# Patient Record
Sex: Male | Born: 1985
Health system: Southern US, Community
[De-identification: ages and names within clinical notes are randomized; demographics above are authoritative.]

---

## 2019-12-11 ENCOUNTER — Other Ambulatory Visit: Payer: Self-pay

## 2019-12-11 ENCOUNTER — Emergency Department (HOSPITAL_COMMUNITY): Payer: 59

## 2019-12-11 ENCOUNTER — Emergency Department (HOSPITAL_COMMUNITY)
Admission: EM | Admit: 2019-12-11 | Discharge: 2019-12-11 | Disposition: A | Discharge status: Home / Self Care | Payer: 59 | Attending: Emergency Medicine | Admitting: Emergency Medicine

## 2019-12-11 ENCOUNTER — Encounter (HOSPITAL_COMMUNITY): Payer: Self-pay

## 2019-12-11 DIAGNOSIS — S060X9A Concussion with loss of consciousness of unspecified duration, initial encounter: Secondary | ICD-10-CM | POA: Diagnosis not present

## 2019-12-11 DIAGNOSIS — S0083XA Contusion of other part of head, initial encounter: Secondary | ICD-10-CM | POA: Diagnosis not present

## 2019-12-11 DIAGNOSIS — R519 Headache, unspecified: Secondary | ICD-10-CM | POA: Diagnosis not present

## 2019-12-11 DIAGNOSIS — Y999 Unspecified external cause status: Secondary | ICD-10-CM | POA: Insufficient documentation

## 2019-12-11 DIAGNOSIS — M25562 Pain in left knee: Secondary | ICD-10-CM | POA: Diagnosis not present

## 2019-12-11 DIAGNOSIS — Y9389 Activity, other specified: Secondary | ICD-10-CM | POA: Diagnosis not present

## 2019-12-11 DIAGNOSIS — S0990XA Unspecified injury of head, initial encounter: Secondary | ICD-10-CM | POA: Diagnosis present

## 2019-12-11 DIAGNOSIS — Z79899 Other long term (current) drug therapy: Secondary | ICD-10-CM | POA: Diagnosis not present

## 2019-12-11 DIAGNOSIS — Y92410 Unspecified street and highway as the place of occurrence of the external cause: Secondary | ICD-10-CM | POA: Insufficient documentation

## 2019-12-11 LAB — COMPREHENSIVE METABOLIC PANEL
ALT: 19 U/L (ref 0–44)
AST: 22 U/L (ref 15–41)
Albumin: 4.3 g/dL (ref 3.5–5.0)
Alkaline Phosphatase: 76 U/L (ref 38–126)
Anion gap: 12 (ref 5–15)
BUN: 12 mg/dL (ref 6–20)
CO2: 21 mmol/L — ABNORMAL LOW (ref 22–32)
Calcium: 9.4 mg/dL (ref 8.9–10.3)
Chloride: 105 mmol/L (ref 98–111)
Creatinine, Ser: 1.05 mg/dL (ref 0.61–1.24)
GFR calc Af Amer: >60 mL/min (ref >60)
GFR calc non Af Amer: >60 mL/min (ref >60)
Glucose, Bld: 101 mg/dL — ABNORMAL HIGH (ref 70–99)
Potassium: 4 mmol/L (ref 3.5–5.1)
Sodium: 138 mmol/L (ref 135–145)
Total Bilirubin: 1.1 mg/dL (ref 0.3–1.2)
Total Protein: 6.9 g/dL (ref 6.5–8.1)

## 2019-12-11 LAB — PROTIME-INR
INR: 1 (ref 0.8–1.2)
Prothrombin Time: 13.5 seconds (ref 11.4–15.2)

## 2019-12-11 LAB — CBC WITH DIFFERENTIAL/PLATELET
Abs Immature Granulocytes: 0.03 10*3/uL (ref 0.00–0.07)
Basophils Absolute: 0 10*3/uL (ref 0.0–0.1)
Basophils Relative: 0 %
Eosinophils Absolute: 0 10*3/uL (ref 0.0–0.5)
Eosinophils Relative: 0 %
HCT: 45.4 % (ref 39.0–52.0)
Hemoglobin: 15.5 g/dL (ref 13.0–17.0)
Immature Granulocytes: 0 %
Lymphocytes Relative: 23 %
Lymphs Abs: 2 10*3/uL (ref 0.7–4.0)
MCH: 28.3 pg (ref 26.0–34.0)
MCHC: 34.1 g/dL (ref 30.0–36.0)
MCV: 83 fL (ref 80.0–100.0)
Monocytes Absolute: 0.8 10*3/uL (ref 0.1–1.0)
Monocytes Relative: 9 %
Neutro Abs: 6 10*3/uL (ref 1.7–7.7)
Neutrophils Relative %: 68 %
Platelets: 210 10*3/uL (ref 150–400)
RBC: 5.47 MIL/uL (ref 4.22–5.81)
RDW: 13.1 % (ref 11.5–15.5)
WBC: 8.9 10*3/uL (ref 4.0–10.5)
nRBC: 0 % (ref 0.0–0.2)

## 2019-12-11 LAB — ETHANOL: Alcohol, Ethyl (B): <10 mg/dL (ref <10)

## 2019-12-11 MED ORDER — ONDANSETRON HCL 4 MG/2ML IJ SOLN
4.0000 mg | Freq: Once | INTRAMUSCULAR | Status: AC
  Administered 2019-12-11: 4 mg via INTRAVENOUS
  Filled 2019-12-11: qty 2

## 2019-12-11 MED ORDER — HYDROMORPHONE HCL 1 MG/ML IJ SOLN
1.0000 mg | Freq: Once | INTRAMUSCULAR | Status: AC
  Administered 2019-12-11: 1 mg via INTRAVENOUS
  Filled 2019-12-11: qty 1

## 2019-12-11 MED ORDER — IOHEXOL 300 MG/ML  SOLN
100.0000 mL | Freq: Once | INTRAMUSCULAR | Status: AC | PRN
  Administered 2019-12-11: 100 mL via INTRAVENOUS

## 2019-12-11 MED ORDER — HYDROCODONE-ACETAMINOPHEN 5-325 MG PO TABS: 1.0000 | ORAL_TABLET | ORAL | 0 refills | Status: DC | PRN

## 2019-12-11 NOTE — ED Notes (Signed)
CT notified that pt is ready for transport 

## 2019-12-11 NOTE — ED Notes (Signed)
Pt wife would like an update

## 2019-12-11 NOTE — ED Notes (Signed)
Discharge instructions reviewed with pt. Pt verbalized understanding.   

## 2019-12-11 NOTE — Discharge Instructions (Signed)
Return if any problems.
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
## 2019-12-16 ENCOUNTER — Other Ambulatory Visit: Payer: Self-pay

## 2019-12-16 ENCOUNTER — Encounter: Payer: Self-pay | Admitting: Orthopaedic Surgery

## 2019-12-16 ENCOUNTER — Ambulatory Visit: Payer: 59 | Admitting: Orthopaedic Surgery

## 2019-12-16 VITALS — Ht 68.5 in | Wt 204.0 lb

## 2019-12-16 DIAGNOSIS — M25562 Pain in left knee: Secondary | ICD-10-CM | POA: Diagnosis not present
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
## 2019-12-20 ENCOUNTER — Other Ambulatory Visit: Payer: Self-pay | Admitting: Orthopaedic Surgery

## 2019-12-20 DIAGNOSIS — Z77018 Contact with and (suspected) exposure to other hazardous metals: Secondary | ICD-10-CM

## 2019-12-21 ENCOUNTER — Telehealth: Payer: Self-pay | Admitting: Orthopaedic Surgery

## 2019-12-21 ENCOUNTER — Other Ambulatory Visit: Payer: Self-pay | Admitting: Physician Assistant

## 2019-12-21 MED ORDER — LIDOCAINE 5 % EX PTCH: 1.0000 | MEDICATED_PATCH | CUTANEOUS | 0 refills | Status: AC

## 2019-12-21 NOTE — Telephone Encounter (Signed)
Just sent in

## 2019-12-21 NOTE — Telephone Encounter (Signed)
Patient called.   A friend of his is using a pain relieving patch called Lidocaine and he wants to discuss being prescribed the same medication.   Call back: [phone]

## 2019-12-22 NOTE — Telephone Encounter (Signed)
Patient aware.

## 2019-12-28 ENCOUNTER — Ambulatory Visit
Admission: RE | Admit: 2019-12-28 | Discharge: 2019-12-28 | Disposition: A | Discharge status: Home / Self Care | Payer: 59 | Source: Ambulatory Visit | Attending: Orthopaedic Surgery | Admitting: Orthopaedic Surgery

## 2019-12-28 ENCOUNTER — Other Ambulatory Visit: Payer: Self-pay

## 2019-12-28 DIAGNOSIS — Z77018 Contact with and (suspected) exposure to other hazardous metals: Secondary | ICD-10-CM

## 2019-12-29 ENCOUNTER — Ambulatory Visit
Admission: RE | Admit: 2019-12-29 | Discharge: 2019-12-29 | Disposition: A | Discharge status: Home / Self Care | Payer: 59 | Source: Ambulatory Visit | Attending: Orthopaedic Surgery | Admitting: Orthopaedic Surgery

## 2019-12-29 ENCOUNTER — Other Ambulatory Visit: Payer: Self-pay

## 2019-12-29 DIAGNOSIS — M25562 Pain in left knee: Secondary | ICD-10-CM

## 2020-01-04 ENCOUNTER — Ambulatory Visit: Payer: 59 | Admitting: Orthopaedic Surgery

## 2020-01-07 ENCOUNTER — Ambulatory Visit: Payer: Self-pay

## 2020-01-07 ENCOUNTER — Encounter: Payer: Self-pay | Admitting: Orthopaedic Surgery

## 2020-01-07 ENCOUNTER — Ambulatory Visit: Payer: 59 | Admitting: Orthopaedic Surgery

## 2020-01-07 ENCOUNTER — Other Ambulatory Visit: Payer: Self-pay

## 2020-01-07 VITALS — Ht 68.5 in | Wt 204.0 lb

## 2020-01-07 DIAGNOSIS — S83412A Sprain of medial collateral ligament of left knee, initial encounter: Secondary | ICD-10-CM

## 2020-01-07 DIAGNOSIS — M25562 Pain in left knee: Secondary | ICD-10-CM | POA: Diagnosis not present
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
## 2020-07-02 IMAGING — CR DG ORBITS FOR FOREIGN BODY
2 series · 2 of 2 positions shown · non-contrast
Comparison: None.

CLINICAL DATA: Metal working/exposure; clearance prior to MRI

EXAM:
ORBITS FOR FOREIGN BODY - 2 VIEW

[w orbit pa (1 of 2)]
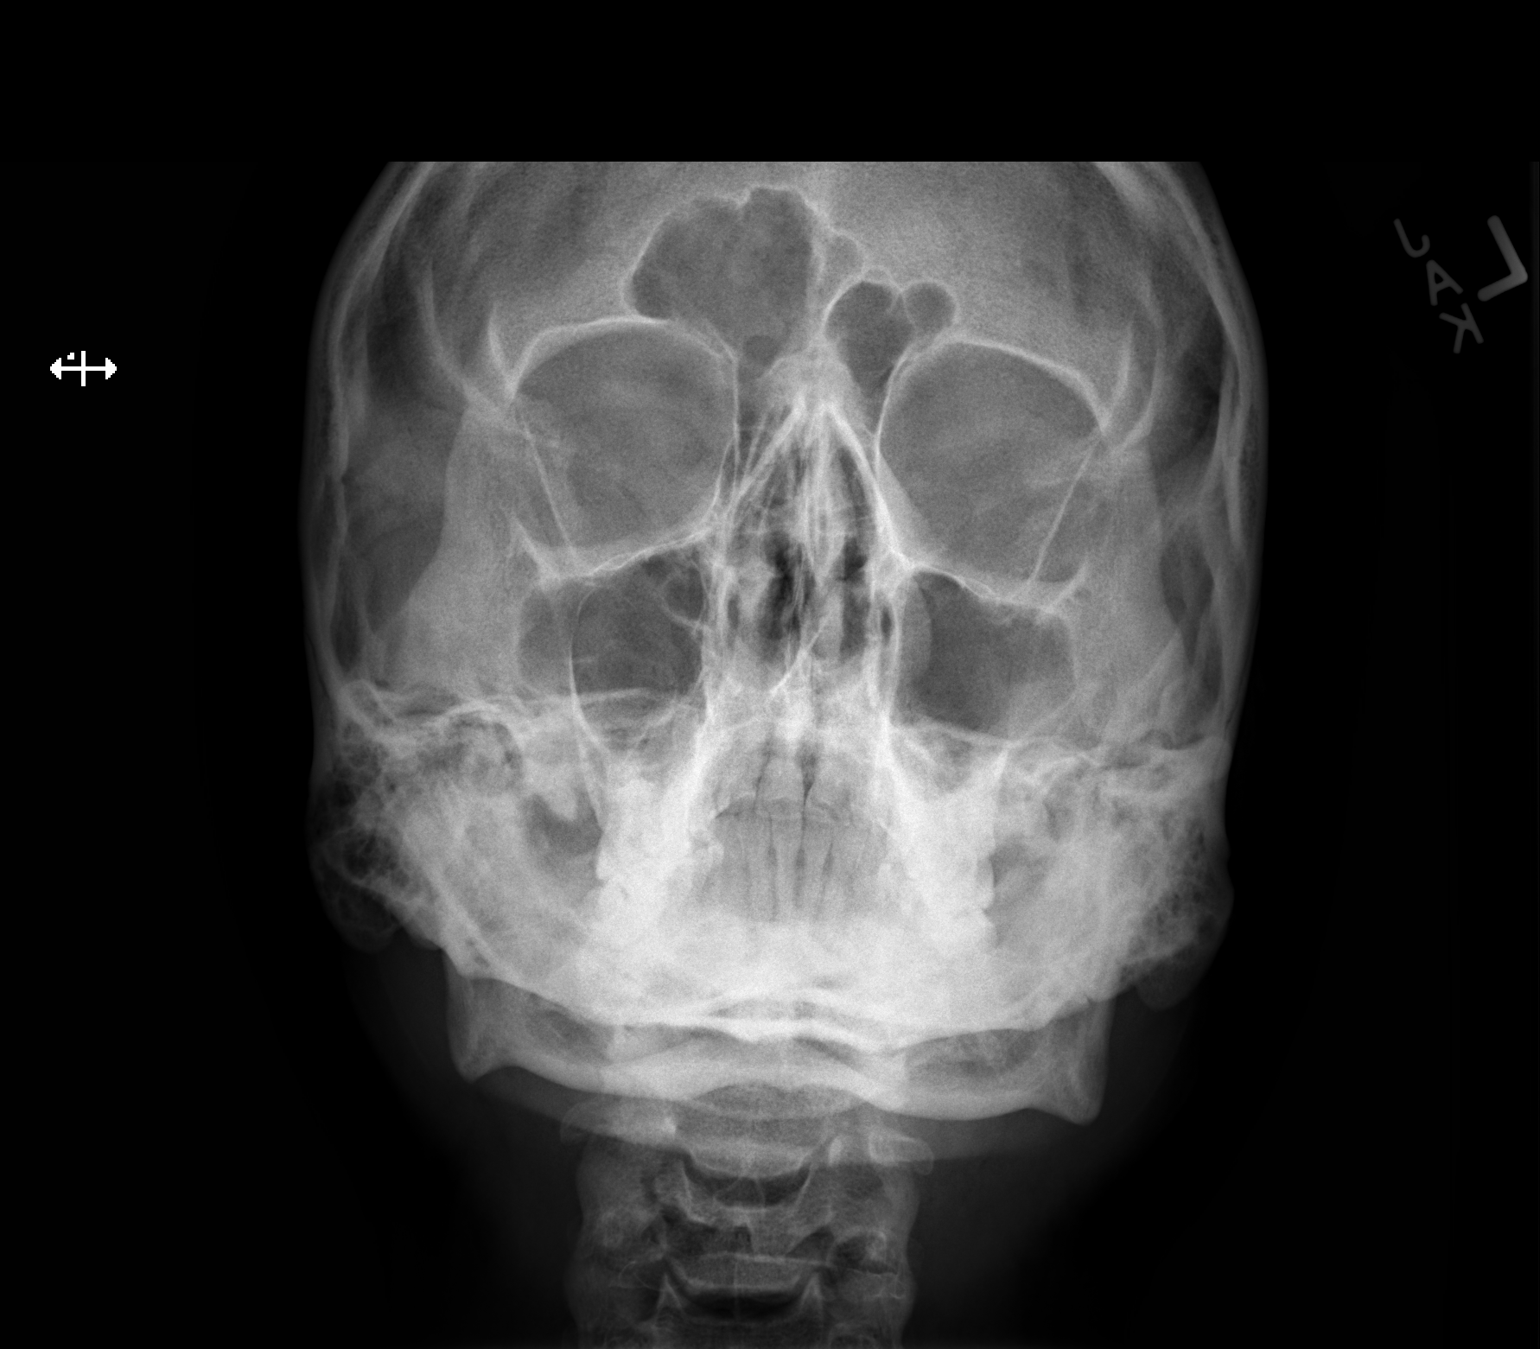

[w orbit pa (2 of 2)]
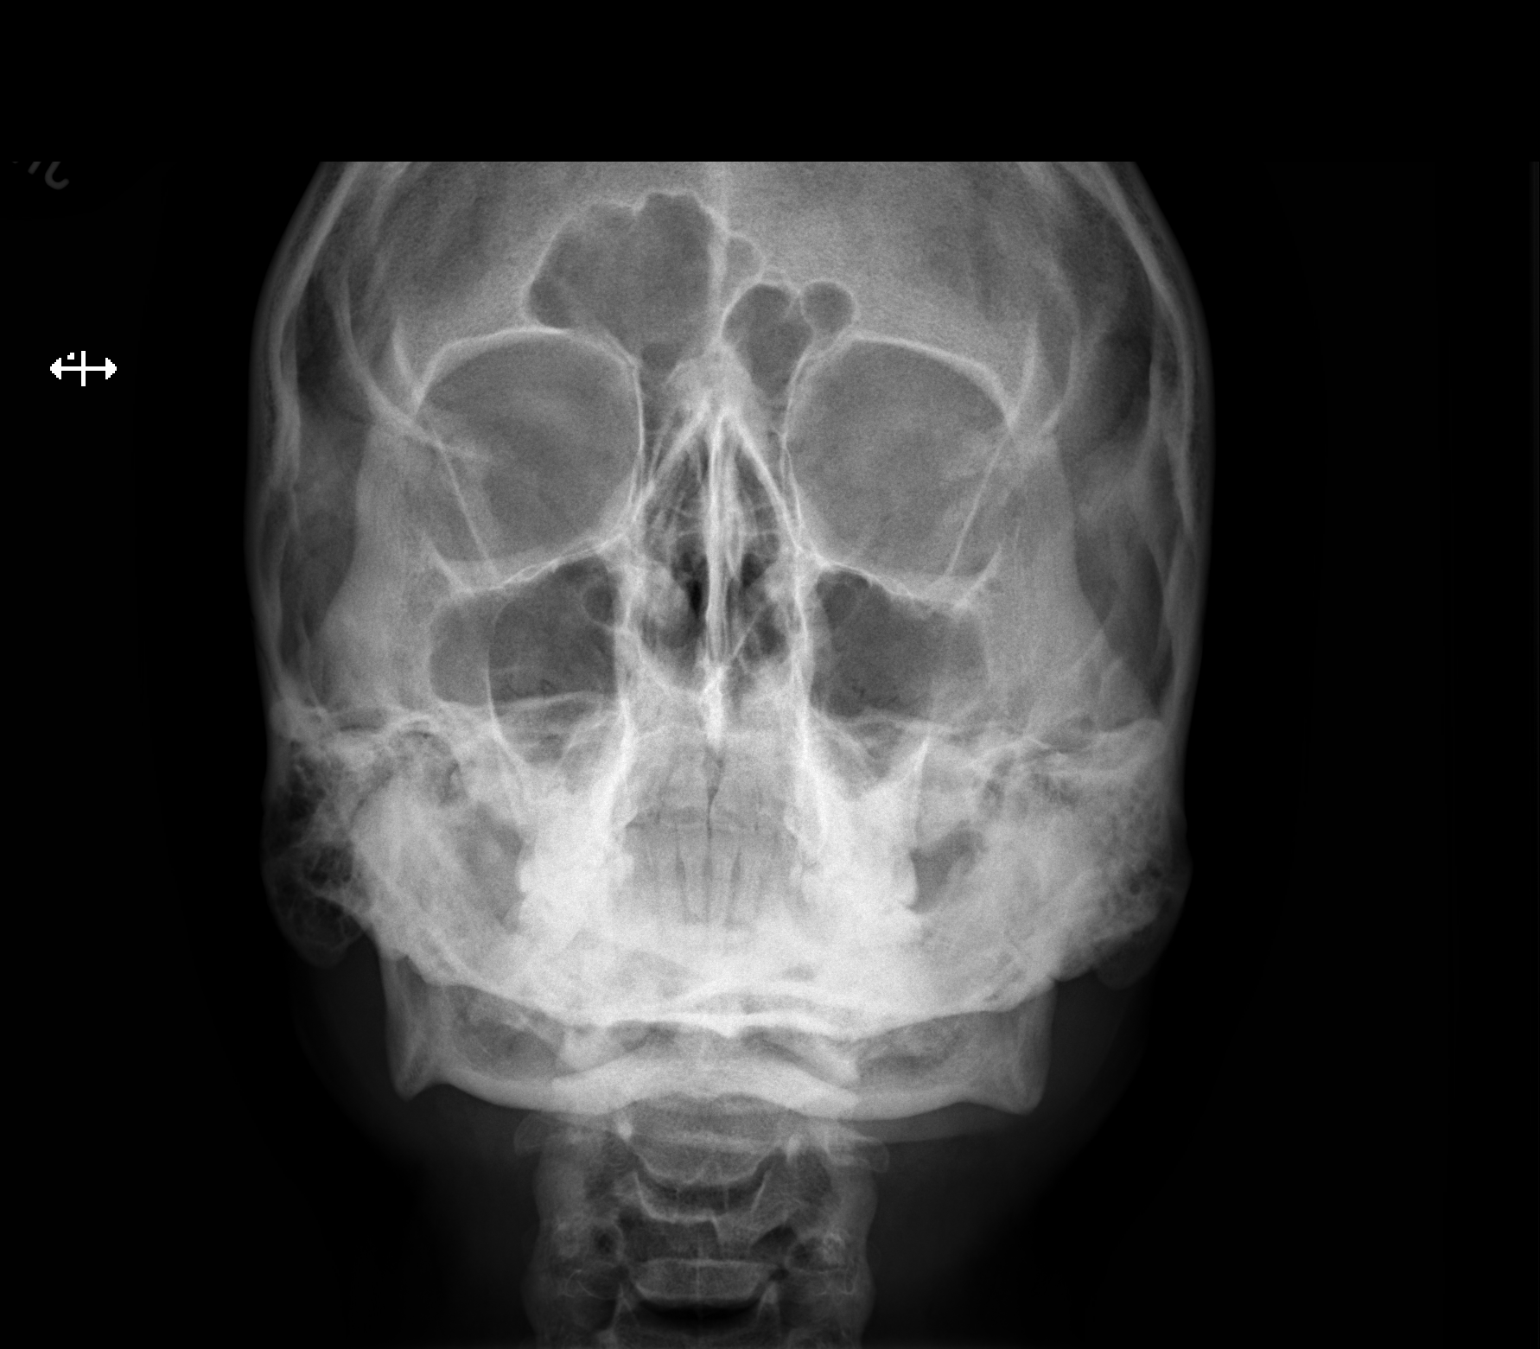

[2 of 2 positions shown; findings below may reference images not displayed]

FINDINGS: There is no evidence of metallic foreign body within the orbits. No
significant bone abnormality identified.
IMPRESSION: No evidence of metallic foreign body within the orbits.

## 2020-11-21 NOTE — Patient Instructions (Signed)
DUE TO COVID-19 ONLY ONE VISITOR IS ALLOWED TO COME WITH YOU AND STAY IN THE WAITING ROOM ONLY DURING PRE OP AND PROCEDURE DAY OF SURGERY. THE 1 VISITOR  MAY VISIT WITH YOU AFTER SURGERY IN YOUR PRIVATE ROOM DURING VISITING HOURS ONLY!  YOU NEED TO HAVE A COVID 19 TEST ON: 11/22/20 @ 10:00 AM, THIS TEST MUST BE DONE BEFORE SURGERY,  COVID TESTING SITE [address] [place] [address], IT IS ON THE RIGHT GOING OUT [address] APPROXIMATELY  2 MINUTES PAST [employer] ON THE RIGHT. ONCE YOUR COVID TEST IS COMPLETED,  PLEASE BEGIN THE QUARANTINE INSTRUCTIONS AS OUTLINED IN YOUR HANDOUT.                Steven Vaughn    Your procedure is scheduled on: 11/24/20   Report to [facility] Main  Entrance   Report to admitting at: 12:00 PM     Call this number if you have problems the morning of surgery [phone]    Remember: Do not eat solid food :After Midnight. Clear liquids until: 11:00 am.  CLEAR LIQUID DIET  Foods Allowed                                                                     Foods Excluded  Coffee and tea, regular and decaf                             liquids that you cannot  Plain Jell-O any favor except red or purple                                           see through such as: Fruit ices (not with fruit pulp)                                     milk, soups, orange juice  Iced Popsicles                                    All solid food Carbonated beverages, regular and diet                                    Cranberry, grape and apple juices Sports drinks like Gatorade Lightly seasoned clear broth or consume(fat free) Sugar, honey syrup  Sample Menu Breakfast                                Lunch                                     Supper Cranberry juice  Beef broth                            Chicken broth Jell-O                                     Grape juice                           Apple juice Coffee or tea                         Jell-O                                      Popsicle                                                Coffee or tea                        Coffee or tea  _____________________________________________________________________  BRUSH YOUR TEETH MORNING OF SURGERY AND RINSE YOUR MOUTH OUT, NO CHEWING GUM CANDY OR MINTS.                              You may not have any metal on your body including hair pins and              piercings  Do not wear jewelry, lotions, powders or perfumes, deodorant             Men may shave face and neck.   Do not bring valuables to the hospital. [facility] IS NOT             RESPONSIBLE   FOR VALUABLES.  Contacts, dentures or bridgework may not be worn into surgery.  Leave suitcase in the car. After surgery it may be brought to your room.     Patients discharged the day of surgery will not be allowed to drive home. IF YOU ARE HAVING SURGERY AND GOING HOME THE SAME DAY, YOU MUST HAVE AN ADULT TO DRIVE YOU HOME AND BE WITH YOU FOR 24 HOURS. YOU MAY GO HOME BY TAXI OR UBER OR ORTHERWISE, BUT AN ADULT MUST ACCOMPANY YOU HOME AND STAY WITH YOU FOR 24 HOURS.  Name and phone number of your driver:  Special Instructions: N/A              Please read over the following fact sheets you were given: _____________________________________________________________________          [facility] - Preparing for Surgery Before surgery, you can play an important role.  Because skin is not sterile, your skin needs to be as free of germs as possible.  You can reduce the number of germs on your skin by washing with CHG (chlorahexidine gluconate) soap before surgery.  CHG is an antiseptic cleaner which kills germs and bonds with the skin to continue killing germs even after washing. Please DO NOT use if you have an allergy to CHG or antibacterial soaps.  If your skin becomes reddened/irritated stop using the CHG and inform your nurse when you arrive at Short Stay. Do not  shave (including legs and underarms) for at least 48 hours prior to the first CHG shower.  You may shave your face/neck. Please follow these instructions carefully:  1.  Shower with CHG Soap the night before surgery and the  morning of Surgery.  2.  If you choose to wash your hair, wash your hair first as usual with your  normal  shampoo.  3.  After you shampoo, rinse your hair and body thoroughly to remove the  shampoo.                           4.  Use CHG as you would any other liquid soap.  You can apply chg directly  to the skin and wash                       Gently with a scrungie or clean washcloth.  5.  Apply the CHG Soap to your body ONLY FROM THE NECK DOWN.   Do not use on face/ open                           Wound or open sores. Avoid contact with eyes, ears mouth and genitals (private parts).                       Wash face,  Genitals (private parts) with your normal soap.             6.  Wash thoroughly, paying special attention to the area where your surgery  will be performed.  7.  Thoroughly rinse your body with warm water from the neck down.  8.  DO NOT shower/wash with your normal soap after using and rinsing off  the CHG Soap.                9.  Pat yourself dry with a clean towel.            10.  Wear clean pajamas.            11.  Place clean sheets on your bed the night of your first shower and do not  sleep with pets. Day of Surgery : Do not apply any lotions/deodorants the morning of surgery.  Please wear clean clothes to the hospital/surgery center.  FAILURE TO FOLLOW THESE INSTRUCTIONS MAY RESULT IN THE CANCELLATION OF YOUR SURGERY PATIENT SIGNATURE_________________________________  NURSE SIGNATURE__________________________________  ________________________________________________________________________

## 2020-11-21 NOTE — Progress Notes (Signed)
Pt. Needs orders for upcoming surgery.PAT and labs on 11/22/20.Thanks

## 2020-11-22 ENCOUNTER — Encounter (HOSPITAL_COMMUNITY): Payer: Self-pay

## 2020-11-22 ENCOUNTER — Inpatient Hospital Stay (HOSPITAL_COMMUNITY)
Admission: RE | Admit: 2020-11-22 | Discharge: 2020-11-22 | Disposition: A | Discharge status: Home / Self Care | Payer: 59 | Source: Ambulatory Visit

## 2020-11-22 ENCOUNTER — Encounter (HOSPITAL_COMMUNITY)
Admission: RE | Admit: 2020-11-22 | Discharge: 2020-11-22 | Disposition: A | Discharge status: Home / Self Care | Payer: BC Managed Care – PPO | Source: Ambulatory Visit | Attending: Orthopedic Surgery | Admitting: Orthopedic Surgery

## 2020-11-22 ENCOUNTER — Other Ambulatory Visit: Payer: Self-pay

## 2020-11-22 DIAGNOSIS — Z880 Allergy status to penicillin: Secondary | ICD-10-CM | POA: Diagnosis not present

## 2020-11-22 DIAGNOSIS — Z01812 Encounter for preprocedural laboratory examination: Secondary | ICD-10-CM | POA: Insufficient documentation

## 2020-11-22 DIAGNOSIS — S83281A Other tear of lateral meniscus, current injury, right knee, initial encounter: Secondary | ICD-10-CM | POA: Diagnosis not present

## 2020-11-22 DIAGNOSIS — X58XXXA Exposure to other specified factors, initial encounter: Secondary | ICD-10-CM | POA: Diagnosis not present

## 2020-11-22 LAB — CBC
HCT: 43.7 % (ref 39.0–52.0)
Hemoglobin: 14.6 g/dL (ref 13.0–17.0)
MCH: 28.7 pg (ref 26.0–34.0)
MCHC: 33.4 g/dL (ref 30.0–36.0)
MCV: 86 fL (ref 80.0–100.0)
Platelets: 176 10*3/uL (ref 150–400)
RBC: 5.08 MIL/uL (ref 4.22–5.81)
RDW: 12.9 % (ref 11.5–15.5)
WBC: 4.8 10*3/uL (ref 4.0–10.5)
nRBC: 0 % (ref 0.0–0.2)

## 2020-11-22 NOTE — Progress Notes (Signed)
Pt tested positive 09/18/20 for covid. Pt has copy of his results and will bring with him on the DOS. Due to 90 day retest policy, pt does not need retest prior to surgery on 2/25.

## 2020-11-22 NOTE — Progress Notes (Signed)
COVID Vaccine Completed: NO Date COVID Vaccine completed: COVID vaccine manufacturer: Pfizer    Moderna   [employer]   PCP - Flora Lipps NP Cardiologist -   Chest x-ray -  EKG -  Stress Test -  ECHO -  Cardiac Cath -  Pacemaker/ICD device last checked:  Sleep Study -  CPAP -   Fasting Blood Sugar -  Checks Blood Sugar _____ times a day  Blood Thinner Instructions: Aspirin Instructions: Last Dose:  Anesthesia review:   Patient denies shortness of breath, fever, cough and chest pain at PAT appointment   Patient verbalized understanding of instructions that were given to them at the PAT appointment. Patient was also instructed that they will need to review over the PAT instructions again at home before surgery.

## 2020-11-24 ENCOUNTER — Ambulatory Visit (HOSPITAL_COMMUNITY): Payer: BC Managed Care – PPO | Admitting: Certified Registered"

## 2020-11-24 ENCOUNTER — Encounter (HOSPITAL_COMMUNITY)
Admission: RE | Disposition: A | Discharge status: Home / Self Care | Payer: Self-pay | Source: Home / Self Care | Attending: Orthopedic Surgery

## 2020-11-24 ENCOUNTER — Encounter (HOSPITAL_COMMUNITY): Payer: Self-pay | Admitting: Orthopedic Surgery

## 2020-11-24 ENCOUNTER — Other Ambulatory Visit: Payer: Self-pay

## 2020-11-24 ENCOUNTER — Ambulatory Visit (HOSPITAL_COMMUNITY)
Admission: RE | Admit: 2020-11-24 | Discharge: 2020-11-24 | Disposition: A | Discharge status: Home / Self Care | Payer: BC Managed Care – PPO | Attending: Orthopedic Surgery | Admitting: Orthopedic Surgery

## 2020-11-24 DIAGNOSIS — Z880 Allergy status to penicillin: Secondary | ICD-10-CM | POA: Insufficient documentation

## 2020-11-24 DIAGNOSIS — X58XXXA Exposure to other specified factors, initial encounter: Secondary | ICD-10-CM | POA: Insufficient documentation

## 2020-11-24 DIAGNOSIS — S83281A Other tear of lateral meniscus, current injury, right knee, initial encounter: Secondary | ICD-10-CM | POA: Insufficient documentation

## 2020-11-24 HISTORY — PX: KNEE ARTHROSCOPY WITH LATERAL MENISECTOMY: SHX6193

## 2020-11-24 SURGERY — ARTHROSCOPY, KNEE, WITH LATERAL MENISCECTOMY
Anesthesia: General | Site: Knee | Laterality: Right

## 2020-11-24 MED ORDER — CEFAZOLIN SODIUM-DEXTROSE 2-4 GM/100ML-% IV SOLN
2.0000 g | INTRAVENOUS | Status: AC
  Administered 2020-11-24: 2 g via INTRAVENOUS
  Filled 2020-11-24: qty 100

## 2020-11-24 MED ORDER — FENTANYL CITRATE (PF) 100 MCG/2ML IJ SOLN
25.0000 ug | INTRAMUSCULAR | Status: DC | PRN
  Administered 2020-11-24 (×3): 50 ug via INTRAVENOUS

## 2020-11-24 MED ORDER — ONDANSETRON HCL 4 MG/2ML IJ SOLN
INTRAMUSCULAR | Status: DC | PRN
  Administered 2020-11-24: 4 mg via INTRAVENOUS

## 2020-11-24 MED ORDER — PROMETHAZINE HCL 25 MG/ML IJ SOLN: 6.2500 mg | INTRAMUSCULAR | Status: DC | PRN

## 2020-11-24 MED ORDER — PROPOFOL 10 MG/ML IV BOLUS
INTRAVENOUS | Status: AC
  Filled 2020-11-24: qty 20

## 2020-11-24 MED ORDER — ROPIVACAINE HCL 5 MG/ML IJ SOLN
INTRAMUSCULAR | Status: DC | PRN
  Administered 2020-11-24: 30 mL via PERINEURAL

## 2020-11-24 MED ORDER — EPHEDRINE 5 MG/ML INJ
INTRAVENOUS | Status: AC
  Filled 2020-11-24: qty 10

## 2020-11-24 MED ORDER — KETOROLAC TROMETHAMINE 30 MG/ML IJ SOLN
30.0000 mg | Freq: Once | INTRAMUSCULAR | Status: AC | PRN
  Administered 2020-11-24: 30 mg via INTRAVENOUS

## 2020-11-24 MED ORDER — KETOROLAC TROMETHAMINE 30 MG/ML IJ SOLN
INTRAMUSCULAR | Status: AC
  Filled 2020-11-24: qty 1

## 2020-11-24 MED ORDER — HYDROCODONE-ACETAMINOPHEN 5-325 MG PO TABS: 1.0000 | ORAL_TABLET | Freq: Four times a day (QID) | ORAL | 0 refills | Status: AC | PRN

## 2020-11-24 MED ORDER — LACTATED RINGERS IV SOLN: INTRAVENOUS | Status: DC

## 2020-11-24 MED ORDER — LIDOCAINE HCL (PF) 2 % IJ SOLN
INTRAMUSCULAR | Status: AC
  Filled 2020-11-24: qty 5

## 2020-11-24 MED ORDER — SODIUM CHLORIDE 0.9 % IR SOLN
Status: DC | PRN
  Administered 2020-11-24: 3000 mL

## 2020-11-24 MED ORDER — DEXAMETHASONE SODIUM PHOSPHATE 10 MG/ML IJ SOLN
INTRAMUSCULAR | Status: DC | PRN
  Administered 2020-11-24: 4 mg via INTRAVENOUS

## 2020-11-24 MED ORDER — FENTANYL CITRATE (PF) 100 MCG/2ML IJ SOLN
INTRAMUSCULAR | Status: AC
  Filled 2020-11-24: qty 2

## 2020-11-24 MED ORDER — MIDAZOLAM HCL 2 MG/2ML IJ SOLN
1.0000 mg | INTRAMUSCULAR | Status: DC
  Administered 2020-11-24: 2 mg via INTRAVENOUS
  Filled 2020-11-24: qty 2

## 2020-11-24 MED ORDER — DEXAMETHASONE SODIUM PHOSPHATE 10 MG/ML IJ SOLN
INTRAMUSCULAR | Status: AC
  Filled 2020-11-24: qty 1

## 2020-11-24 MED ORDER — LIDOCAINE 2% (20 MG/ML) 5 ML SYRINGE
INTRAMUSCULAR | Status: DC | PRN
  Administered 2020-11-24: 60 mg via INTRAVENOUS

## 2020-11-24 MED ORDER — OXYCODONE HCL 5 MG/5ML PO SOLN: 5.0000 mg | Freq: Once | ORAL | Status: AC | PRN

## 2020-11-24 MED ORDER — FENTANYL CITRATE (PF) 100 MCG/2ML IJ SOLN
INTRAMUSCULAR | Status: AC
  Filled 2020-11-24: qty 4

## 2020-11-24 MED ORDER — SODIUM CHLORIDE 0.9 % IR SOLN
Status: DC | PRN
  Administered 2020-11-24: 1000 mL

## 2020-11-24 MED ORDER — FENTANYL CITRATE (PF) 100 MCG/2ML IJ SOLN
INTRAMUSCULAR | Status: DC | PRN
  Administered 2020-11-24 (×2): 50 ug via INTRAVENOUS

## 2020-11-24 MED ORDER — OXYCODONE HCL 5 MG PO TABS
5.0000 mg | ORAL_TABLET | Freq: Once | ORAL | Status: AC | PRN
  Administered 2020-11-24: 5 mg via ORAL

## 2020-11-24 MED ORDER — BUPIVACAINE HCL (PF) 0.25 % IJ SOLN
INTRAMUSCULAR | Status: AC
  Filled 2020-11-24: qty 30

## 2020-11-24 MED ORDER — ORAL CARE MOUTH RINSE: 15.0000 mL | Freq: Once | OROMUCOSAL | Status: AC

## 2020-11-24 MED ORDER — CHLORHEXIDINE GLUCONATE 0.12 % MT SOLN
15.0000 mL | Freq: Once | OROMUCOSAL | Status: AC
  Administered 2020-11-24: 15 mL via OROMUCOSAL

## 2020-11-24 MED ORDER — OXYCODONE HCL 5 MG PO TABS
ORAL_TABLET | ORAL | Status: AC
  Filled 2020-11-24: qty 1

## 2020-11-24 MED ORDER — ONDANSETRON 4 MG PO TBDP: 4.0000 mg | ORAL_TABLET | Freq: Three times a day (TID) | ORAL | 0 refills | Status: AC | PRN

## 2020-11-24 MED ORDER — PROPOFOL 10 MG/ML IV BOLUS
INTRAVENOUS | Status: DC | PRN
  Administered 2020-11-24: 200 mg via INTRAVENOUS

## 2020-11-24 MED ORDER — ONDANSETRON HCL 4 MG/2ML IJ SOLN
INTRAMUSCULAR | Status: AC
  Filled 2020-11-24: qty 2

## 2020-11-24 MED ORDER — EPHEDRINE SULFATE-NACL 50-0.9 MG/10ML-% IV SOSY
PREFILLED_SYRINGE | INTRAVENOUS | Status: DC | PRN
  Administered 2020-11-24: 10 mg via INTRAVENOUS

## 2020-11-24 MED ORDER — FENTANYL CITRATE (PF) 100 MCG/2ML IJ SOLN
50.0000 ug | INTRAMUSCULAR | Status: DC
  Administered 2020-11-24: 100 ug via INTRAVENOUS
  Filled 2020-11-24: qty 2

## 2020-11-24 MED ORDER — BUPIVACAINE HCL 0.25 % IJ SOLN
INTRAMUSCULAR | Status: DC | PRN
  Administered 2020-11-24: 11 mL

## 2020-11-24 SURGICAL SUPPLY — 38 items
ABLATOR ASPIRATE 50D MULTI-PRT (SURGICAL WAND) IMPLANT
ANCHOR SUT MENISCAL STR 2-0 (Anchor) ×4 IMPLANT
BLADE SHAVER TORPEDO 4X13 (MISCELLANEOUS) ×2 IMPLANT
BLADE SURG SZ11 CARB STEEL (BLADE) ×2 IMPLANT
BNDG ELASTIC 6X5.8 VLCR STR LF (GAUZE/BANDAGES/DRESSINGS) ×2 IMPLANT
CHLORAPREP W/TINT 26 (MISCELLANEOUS) ×2 IMPLANT
CLSR STERI-STRIP ANTIMIC 1/2X4 (GAUZE/BANDAGES/DRESSINGS) ×2 IMPLANT
COVER SURGICAL LIGHT HANDLE (MISCELLANEOUS) ×2 IMPLANT
COVER WAND RF STERILE (DRAPES) ×2 IMPLANT
CUFF TOURN SGL QUICK 34 (TOURNIQUET CUFF)
CUFF TRNQT CYL 34X4.125X (TOURNIQUET CUFF) IMPLANT
CUTTER TENSIONER SUT 2-0 0 FBW (INSTRUMENTS) ×2 IMPLANT
DRAPE ARTHROSCOPY W/POUCH 114 (DRAPES) ×2 IMPLANT
DRAPE IMP U-DRAPE 54X76 (DRAPES) ×2 IMPLANT
DRAPE SHEET LG 3/4 BI-LAMINATE (DRAPES) IMPLANT
DRAPE U-SHAPE 47X51 STRL (DRAPES) ×2 IMPLANT
DRSG PAD ABDOMINAL 8X10 ST (GAUZE/BANDAGES/DRESSINGS) ×2 IMPLANT
GAUZE SPONGE 4X4 12PLY STRL (GAUZE/BANDAGES/DRESSINGS) ×2 IMPLANT
GAUZE XEROFORM 1X8 LF (GAUZE/BANDAGES/DRESSINGS) ×2 IMPLANT
GLOVE SRG 8 PF TXTR STRL LF DI (GLOVE) ×1 IMPLANT
GLOVE SURG ENC MOIS LTX SZ7.5 (GLOVE) ×2 IMPLANT
GLOVE SURG UNDER POLY LF SZ8 (GLOVE) ×2
GOWN STRL REUS W/TWL LRG LVL3 (GOWN DISPOSABLE) ×2 IMPLANT
KIT TURNOVER KIT A (KITS) ×2 IMPLANT
MANIFOLD NEPTUNE II (INSTRUMENTS) ×2 IMPLANT
PACK ARTHROSCOPY WL (CUSTOM PROCEDURE TRAY) ×2 IMPLANT
PADDING CAST COTTON 6X4 STRL (CAST SUPPLIES) IMPLANT
PENCIL SMOKE EVACUATOR (MISCELLANEOUS) IMPLANT
PORT APPOLLO RF 90DEGREE MULTI (SURGICAL WAND) IMPLANT
PORTAL SKID DEVICE (INSTRUMENTS) ×2 IMPLANT
PROBE HOOK APOLLO (SURGICAL WAND) IMPLANT
SUT ETHILON 4 0 PS 2 18 (SUTURE) ×2 IMPLANT
SUT MNCRL AB 3-0 PS2 18 (SUTURE) ×2 IMPLANT
SYR CONTROL 10ML LL (SYRINGE) ×2 IMPLANT
TOWEL OR 17X26 10 PK STRL BLUE (TOWEL DISPOSABLE) ×2 IMPLANT
TUBING ARTHROSCOPY IRRIG 16FT (MISCELLANEOUS) ×2 IMPLANT
WAND APOLLORF SJ50 AR-9845 (SURGICAL WAND) ×2 IMPLANT
WRAP KNEE MAXI GEL POST OP (GAUZE/BANDAGES/DRESSINGS) IMPLANT

## 2020-11-24 NOTE — Discharge Instructions (Signed)
Post-operative patient instructions  Knee Arthroscopy    Ice:  Place intermittent ice or cooler pack over your knee, 30 minutes on and 30 minutes off.  Continue this for the first 72 hours after surgery, then save ice for use after therapy sessions or on more active days.    Weight: Touchdown weightbearing to right lower extremity with crutches  Crutches:  Use crutches (or walker) to assist in walking until told to discontinue by your physical therapist or physician. This will help to reduce pain.  Strengthening:  Perform simple thigh squeezes (isometric quad contractions) and straight leg lifts as you are able (3 sets of 5 to 10 repetitions, 3 times a day).  For the leg lifts, have someone support under your ankle in the beginning until you have increased strength enough to do this on your own.  Motion: Maintain your knee in the immobilizer/brace at all times.  You will slowly begin range of motion at the knee under the supervision of your physical therapist.   dressing:  Perform 1st dressing change at 3 days postoperative. A moderate amount of blood tinged drainage is to be expected.  So if you bleed through the dressing on the first or second day or if you have fevers, it is fine to change the dressing/check the wounds early and redress wound. Elevate your leg.  If it bleeds through again, or if the incisions are leaking frank blood, please call the office. May change dressing every 1-2 days thereafter to help watch wounds. Can purchase Tegderm (or 42M Nexcare) water resistant dressings at local pharmacy / [employer].  Shower:  Light shower is ok after 3 days.  Please take shower, NO bath. Recover with gauze and ace wrap to help keep wounds protected.    Pain medication:  A narcotic pain medication has been prescribed.  Take as directed.  Typically you need narcotic pain medication more regularly during the first 3 to 5 days after surgery.  Decrease your use of the medication as the pain improves.   Narcotics can sometimes cause constipation, even after a few doses.  If you have problems with constipation, you can take an over the counter stool softener or light laxative.  If you have persistent problems, please notify your physicians office.  Physical therapy: Additional activity guidelines to be provided by your physician or physical therapist at follow-up visits.   Driving: Do not recommend driving x 2 weeks post surgical, especially if surgery performed on right side. Should not drive while taking narcotic pain medications. It typically takes at least 2 weeks to restore sufficient neuromuscular function for normal reaction times for driving safety.   Call [phone] for questions or problems. Evenings you will be forwarded to the hospital operator.  Ask for the orthopaedic physician on call. Please call if you experience:    o Redness, foul smelling, or persistent drainage from the surgical site  o worsening knee pain and swelling not responsive to medication  o any calf pain and or swelling of the lower leg  o temperatures greater than 101.5 F o other questions or concerns   Thank you for allowing [place] to be a part of your care  General Anesthesia, Adult, Care After This sheet gives you information about how to care for yourself after your procedure. Your health care provider may also give you more specific instructions. If you have problems or questions, contact your health care provider. What can I expect after the procedure? After the procedure, the following  side effects are common:  Pain or discomfort at the IV site.  Nausea.  Vomiting.  Sore throat.  Trouble concentrating.  Feeling cold or chills.  Feeling weak or tired.  Sleepiness and fatigue.  Soreness and body aches. These side effects can affect parts of the body that were not involved in surgery. Follow these instructions at home: For the time period you were told by your health care  provider:  Rest.  Do not participate in activities where you could fall or become injured.  Do not drive or use machinery.  Do not drink alcohol.  Do not take sleeping pills or medicines that cause drowsiness.  Do not make important decisions or sign legal documents.  Do not take care of children on your own.   Eating and drinking  Follow any instructions from your health care provider about eating or drinking restrictions.  When you feel hungry, start by eating small amounts of foods that are soft and easy to digest (bland), such as toast. Gradually return to your regular diet.  Drink enough fluid to keep your urine pale yellow.  If you vomit, rehydrate by drinking water, juice, or clear broth. General instructions  If you have sleep apnea, surgery and certain medicines can increase your risk for breathing problems. Follow instructions from your health care provider about wearing your sleep device: ? Anytime you are sleeping, including during daytime naps. ? While taking prescription pain medicines, sleeping medicines, or medicines that make you drowsy.  Have a responsible adult stay with you for the time you are told. It is important to have someone help care for you until you are awake and alert.  Return to your normal activities as told by your health care provider. Ask your health care provider what activities are safe for you.  Take over-the-counter and prescription medicines only as told by your health care provider.  If you smoke, do not smoke without supervision.  Keep all follow-up visits as told by your health care provider. This is important. Contact a health care provider if:  You have nausea or vomiting that does not get better with medicine.  You cannot eat or drink without vomiting.  You have pain that does not get better with medicine.  You are unable to pass urine.  You develop a skin rash.  You have a fever.  You have redness around your IV site  that gets worse. Get help right away if:  You have difficulty breathing.  You have chest pain.  You have blood in your urine or stool, or you vomit blood. Summary  After the procedure, it is common to have a sore throat or nausea. It is also common to feel tired.  Have a responsible adult stay with you for the time you are told. It is important to have someone help care for you until you are awake and alert.  When you feel hungry, start by eating small amounts of foods that are soft and easy to digest (bland), such as toast. Gradually return to your regular diet.  Drink enough fluid to keep your urine pale yellow.  Return to your normal activities as told by your health care provider. Ask your health care provider what activities are safe for you. This information is not intended to replace advice given to you by your health care provider. Make sure you discuss any questions you have with your health care provider. Document Revised: 06/01/2020 Document Reviewed: 12/30/2019 Elsevier Patient Education  2021 Elsevier  Inc.

## 2020-11-24 NOTE — Progress Notes (Signed)
Assisted Dr. Eilene Ghazi with right adductor canal  block. Side rails up, monitors on throughout procedure. See vital signs in flow sheet. Tolerated Procedure well.

## 2020-11-24 NOTE — Anesthesia Procedure Notes (Signed)
Anesthesia Regional Block: Adductor canal block   Pre-Anesthetic Checklist: ,, timeout performed, Correct Patient, Correct Site, Correct Laterality, Correct Procedure, Correct Position, site marked, Risks and benefits discussed,  Surgical consent,  Pre-op evaluation,  At surgeon's request and post-op pain management  Laterality: Right  Prep: chloraprep       Needles:  Injection technique: Single-shot  Needle Type: Echogenic Needle     Needle Length: 9cm      Additional Needles:   Procedures:,,,, ultrasound used (permanent image in chart),,,,  Narrative:  Start time: 11/24/2020 12:16 PM End time: 11/24/2020 12:23 PM Injection made incrementally with aspirations every 5 mL.  Performed by: Personally  Anesthesiologist: Eilene Ghazi, MD  Additional Notes: Patient tolerated the procedure well without complications

## 2020-11-24 NOTE — Brief Op Note (Signed)
11/24/2020  1:37 PM  PATIENT:  Steven Vaughn  35 y.o. male  PRE-OPERATIVE DIAGNOSIS:  Right knee lateral meniscus tear  POST-OPERATIVE DIAGNOSIS:  Right knee lateral meniscus tear  PROCEDURE:  Procedure(s) with comments: Right knee arthroscopic partial lateral repair (Right) - 75 mins  SURGEON:  Surgeon(s) and Role:    * Aundria Rud, Noah Delaine, MD - Primary  PHYSICIAN ASSISTANT: Dion Saucier, PA-C  ANESTHESIA:   local, regional and general  EBL:  5 cc  BLOOD ADMINISTERED:none  DRAINS: none   LOCAL MEDICATIONS USED:  marcaine  SPECIMEN:  No Specimen  DISPOSITION OF SPECIMEN:  N/A  COUNTS:  YES  TOURNIQUET:  * No tourniquets in log *  DICTATION: .Note written in EPIC  PLAN OF CARE: Discharge to home after PACU  PATIENT DISPOSITION:  PACU - hemodynamically stable.   Delay start of Pharmacological VTE agent (>24hrs) due to surgical blood loss or risk of bleeding: not applicable

## 2020-11-24 NOTE — Transfer of Care (Signed)
Immediate Anesthesia Transfer of Care Note  Patient: Steven Vaughn  Procedure(s) Performed: Right knee arthroscopic partial lateral repair (Right Knee)  Patient Location: PACU  Anesthesia Type:General  Level of Consciousness: awake, alert  and oriented  Airway & Oxygen Therapy: Patient Spontanous Breathing and Patient connected to face mask oxygen  Post-op Assessment: Report given to RN and Post -op Vital signs reviewed and stable  Post vital signs: Reviewed and stable  Last Vitals:  Vitals Value Taken Time  BP    Temp    Pulse    Resp    SpO2      Last Pain:  Vitals:   11/24/20 1157  TempSrc:   PainSc: 0-No pain         Complications: No complications documented.

## 2020-11-24 NOTE — Op Note (Signed)
Surgery Date: 11/24/2020  Surgeon(s): Yolonda Kida, MD  ANESTHESIA:  general, with regional   Assistant:  Dion Saucier, PA-C  Assistant attestation: PA Mcclung was utilized throughout the procedure for positioning patient, manipulating the knee and tissue for the meniscus repair. He was also utilized for sound closure application of brace.  FLUIDS: Per anesthesia record.   ESTIMATED BLOOD LOSS: minimal  Implants: Arthrex meniscal cinch repair device x2  PREOPERATIVE DIAGNOSES:  1. Right  knee lateral meniscus tear 2.  Right knee synovitis  POSTOPERATIVE DIAGNOSES:  same  PROCEDURES PERFORMED:  1. Right knee arthroscopy with limited synovectomy 2. knee arthroscopy with arthroscopic Lateral meniscus repair  DESCRIPTION OF PROCEDURE: Steven Vaughn is a 35 y.o.-year-old male with right knee lateral meniscus tear. Plans are to proceed with repair versus meniscectomy and diagnostic arthroscopy with debridement as indicated. Full discussion held regarding risks benefits alternatives and complications related surgical intervention. Conservative care options reviewed. All questions answered.  The patient was identified in the preoperative holding area and the operative extremity was marked. The patient was brought to the operating room and transferred to operating table in a supine position. Satisfactory general anesthesia was induced by anesthesiology.    Standard anterolateral, anteromedial arthroscopy portals were obtained. The anteromedial portal was obtained with a spinal needle for localization under direct visualization with subsequent diagnostic findings.   Anteromedial and anterolateral chambers: moderate synovitis. The synovitis was debrided with a 4.5 mm full radius shaver through both the anteromedial and lateral portals.   Suprapatellar pouch and gutters: no synovitis or debris. Patella chondral surface: Grade 0 Trochlear chondral surface: Grade 0 Patellofemoral  tracking: level and midline Medial meniscus: intact.  Medial femoral condyle flexion bearing surface: Grade 0 Medial femoral condyle extension bearing surface: Grade 0 Medial tibial plateau: Grade 0 Anterior cruciate ligament:stable Posterior cruciate ligament:stable Lateral meniscus: Complex tearing of the lateral meniscus with a vertical component of the posterior horn from the popliteal hiatus to the root.  This was unstable when probed.  There was also a horizontal component short radial component noted in the mid body..   Lateral femoral condyle flexion bearing surface: Grade 0 Lateral femoral condyle extension bearing surface: Grade 0 Lateral tibial plateau: Grade 1  Lateral meniscus was treated with partial lateral meniscectomy of the mid body at the level of the horizontal and short radial tears.  These were white zone tears.  The posterior horn vertical component was treated with an all inside repair technique with 2 vertical mattress sutures using the Arthrex meniscal cinch.  This was a red zone tear.  We first prepared the tissue biologically spinal needle fenestration.  We then delivered 2 separate vertical mattress repair sutures while viewing from the medial portal and working from the lateral portal.  Suture tails were cut flush.  This created a nice and secure repair.  After completion of synovectomy, diagnostic exam, and debridements as described, all compartments were checked and no residual debris remained. Hemostasis was achieved with the cautery wand. The portals were approximated with buried monocryl. All excess fluid was expressed from the joint.  Xeroform sterile gauze dressings were applied followed by Ace bandage and ice pack.   DISPOSITION:  Steven Vaughn will be placed in a Bledsoe knee brace in full extension.  He will be partial weightbearing for the next 6 weeks with crutches.  He will present to the office in 2 weeks for routine postop care.

## 2020-11-24 NOTE — Anesthesia Procedure Notes (Signed)
Procedure Name: LMA Insertion Date/Time: 11/24/2020 12:47 PM Performed by: Nelle Don, CRNA Pre-anesthesia Checklist: Patient identified, Emergency Drugs available, Suction available and Patient being monitored Patient Re-evaluated:Patient Re-evaluated prior to induction Oxygen Delivery Method: Circle system utilized Preoxygenation: Pre-oxygenation with 100% oxygen Induction Type: IV induction LMA: LMA inserted LMA Size: 4.0 Number of attempts: 1 Dental Injury: Teeth and Oropharynx as per pre-operative assessment

## 2020-11-24 NOTE — Anesthesia Procedure Notes (Signed)
Anesthesia Procedure Image    

## 2020-11-24 NOTE — Progress Notes (Signed)
Orthopedic Tech Progress Note Patient Details:  Steven Vaughn [birth year] [id number]  Ortho Devices Type of Ortho Device: Crutches Ortho Device/Splint Interventions: Ordered,Adjustment   Post Interventions Patient Tolerated: Well Instructions Provided: Care of device   Jennye Moccasin 11/24/2020, 3:41 PM

## 2020-11-24 NOTE — Anesthesia Preprocedure Evaluation (Signed)
Anesthesia Evaluation  Patient identified by MRN, date of birth, ID band Patient awake    Reviewed: Allergy & Precautions, H&P , NPO status , Patient's Chart, lab work & pertinent test results  Airway Mallampati: II  TM Distance: >3 FB Neck ROM: Full    Dental no notable dental hx.    Pulmonary neg pulmonary ROS,    Pulmonary exam normal breath sounds clear to auscultation       Cardiovascular negative cardio ROS Normal cardiovascular exam Rhythm:Regular Rate:Normal     Neuro/Psych negative neurological ROS  negative psych ROS   GI/Hepatic negative GI ROS, Neg liver ROS,   Endo/Other  obesity  Renal/GU negative Renal ROS  negative genitourinary   Musculoskeletal negative musculoskeletal ROS (+)   Abdominal   Peds negative pediatric ROS (+)  Hematology negative hematology ROS (+)   Anesthesia Other Findings   Reproductive/Obstetrics negative OB ROS                             Anesthesia Physical Anesthesia Plan  ASA: II  Anesthesia Plan: General   Post-op Pain Management:    Induction: Intravenous  PONV Risk Score and Plan: 2 and Ondansetron, Dexamethasone and Treatment may vary due to age or medical condition  Airway Management Planned: LMA  Additional Equipment:   Intra-op Plan:   Post-operative Plan: Extubation in OR  Informed Consent: I have reviewed the patients History and Physical, chart, labs and discussed the procedure including the risks, benefits and alternatives for the proposed anesthesia with the patient or authorized representative who has indicated his/her understanding and acceptance.     Dental advisory given  Plan Discussed with: CRNA and Surgeon  Anesthesia Plan Comments:         Anesthesia Quick Evaluation

## 2020-11-24 NOTE — Progress Notes (Signed)
Orthopedic Tech Progress Note Patient Details:  Carle Fenech April 24, 1986 [id number] Called [facility] for brace order. Patient ID: Armour Villanueva, male   DOB: 1986-06-12, 35 y.o.   MRN: [id number]   Jennye Moccasin 11/24/2020, 2:05 PM

## 2020-11-24 NOTE — H&P (Signed)
ORTHOPAEDIC H and :  REQUESTING PHYSICIAN: Yolonda Kida, MD  PCP:  Adela Glimpse, NP  Chief Complaint: Right knee pain.  HPI: Steven Vaughn is a 35 y.o. male who complains of right knee pain and mechanical catching and swelling.  This is ongoing after little bit over a year.  To today for arthroscopic surgery to include possible lateral meniscus repair versus possible lateral meniscectomy.  We again discussed this procedure in detail.  No new complaints today.  History reviewed. No pertinent past medical history. Past Surgical History:  Procedure Laterality Date  . NO PAST SURGERIES     Social History   Socioeconomic History  . Marital status: Single    Spouse name: Not on file  . Number of children: Not on file  . Years of education: Not on file  . Highest education level: Not on file  Occupational History  . Not on file  Tobacco Use  . Smoking status: Never Smoker  . Smokeless tobacco: Never Used  Vaping Use  . Vaping Use: Never used  Substance and Sexual Activity  . Alcohol use: Never  . Drug use: Never  . Sexual activity: Not on file  Other Topics Concern  . Not on file  Social History Narrative  . Not on file   Social Determinants of Health   Financial Resource Strain: Not on file  Food Insecurity: Not on file  Transportation Needs: Not on file  Physical Activity: Not on file  Stress: Not on file  Social Connections: Not on file   History reviewed. No pertinent family history. Allergies  Allergen Reactions  . Penicillins     Did it involve swelling of the face/tongue/throat, SOB, or low BP?No Did it involve sudden or severe rash/hives, skin peeling, or any reaction on the inside of your mouth or nose? Rash Did you need to seek medical attention at a hospital or doctor's office? Yes  When did it last happen?Child  If all above answers are "NO", may proceed with cephalosporin use.    Prior to Admission medications   Medication Sig Start  Date End Date Taking? Authorizing Provider  diclofenac Sodium (VOLTAREN) 1 % GEL Apply 1 application topically 2 (two) times daily as needed (pain). 11/06/20  Yes [provider]  ibuprofen (ADVIL) 800 MG tablet Take 800 mg by mouth 3 (three) times daily as needed. 11/06/20  Yes [provider]  lidocaine (LIDODERM) 5 % Place 1 patch onto the skin daily. Remove & Discard patch within 12 hours or as directed by MD Patient taking differently: Place 1 patch onto the skin daily as needed (pain). Remove & Discard patch within 12 hours or as directed by MD 12/21/19  Yes Cristie Hem, PA-C  methocarbamol (ROBAXIN) 500 MG tablet Take 500 mg by mouth every 6 (six) hours as needed. 11/08/20  Yes [provider]   No results found.  Positive ROS: All other systems have been reviewed and were otherwise negative with the exception of those mentioned in the HPI and as above.  Physical Exam: General: Alert, no acute distress Cardiovascular: No pedal edema Respiratory: No cyanosis, no use of accessory musculature GI: No organomegaly, abdomen is soft and non-tender Skin: No lesions in the area of chief complaint Neurologic: Sensation intact distally Psychiatric: Patient is competent for consent with normal mood and affect Lymphatic: No axillary or cervical lymphadenopathy  MUSCULOSKELETAL:  Right lower extremity is warm and well-perfused with no open wounds.  Neurovascular intact.  Assessment:  Right knee acute lateral meniscus tear complex.  Plan: -Plan to proceed today with right knee arthroscopy with possible lateral meniscectomy versus possible lateral meniscus repair.  We again reviewed the risk and benefits of this procedure in detail.  All questions were solicited and answered to his satisfaction.  Discussed the risk of bleeding, infection, damage to surrounding nerves and vessels, stiffness, failure of repair, need for further surgery, development of arthritis, development  of DVT, and the risk of anesthesia.  -Plan for discharge home postoperatively from PACU.    Yolonda Kida, MD Cell [phone]    11/24/2020 12:35 PM

## 2020-11-27 ENCOUNTER — Encounter (HOSPITAL_COMMUNITY): Payer: Self-pay | Admitting: Orthopedic Surgery

## 2020-11-27 NOTE — Anesthesia Postprocedure Evaluation (Signed)
Anesthesia Post Note  Patient: Steven Vaughn  Procedure(s) Performed: Right knee arthroscopic partial lateral repair (Right Knee)     Patient location during evaluation: PACU Anesthesia Type: General Level of consciousness: awake and alert Pain management: pain level controlled Vital Signs Assessment: post-procedure vital signs reviewed and stable Respiratory status: spontaneous breathing, nonlabored ventilation, respiratory function stable and patient connected to nasal cannula oxygen Cardiovascular status: blood pressure returned to baseline and stable Postop Assessment: no apparent nausea or vomiting Anesthetic complications: no   No complications documented.  Last Vitals:  Vitals:   11/24/20 1500 11/24/20 1509  BP: 122/87 125/82  Pulse: 73 66  Resp: 20 18  Temp: (!) 36.4 C 36.5 C  SpO2: 100% 100%    Last Pain:  Vitals:   11/24/20 1509  TempSrc: Oral  PainSc: 3                  Shacola Schussler S
